# Patient Record
Sex: Male | Born: 1985 | Race: White | Hispanic: No | Marital: Married | Smoking: Former smoker
Health system: Southern US, Community
[De-identification: ages and names within clinical notes are randomized; demographics above are authoritative.]

---

## 2017-12-05 ENCOUNTER — Other Ambulatory Visit: Payer: Self-pay | Admitting: Gastroenterology

## 2017-12-05 DIAGNOSIS — R1013 Epigastric pain: Secondary | ICD-10-CM

## 2017-12-15 ENCOUNTER — Ambulatory Visit
Admission: RE | Admit: 2017-12-15 | Discharge: 2017-12-15 | Disposition: A | Discharge status: Home / Self Care | Payer: BC Managed Care – PPO | Source: Ambulatory Visit | Attending: Gastroenterology | Admitting: Gastroenterology

## 2017-12-15 DIAGNOSIS — R1013 Epigastric pain: Secondary | ICD-10-CM

## 2018-11-08 IMAGING — US US ABDOMEN LIMITED
1 series · 14 of 25 positions shown · non-contrast
Comparison: No prior.

CLINICAL DATA: Epigastric pain.

EXAM:
ULTRASOUND ABDOMEN LIMITED RIGHT UPPER QUADRANT

[Series 1: us abdomen limited · 0.17mm/px · 14 of 38 slices shown]
[im 1/38]
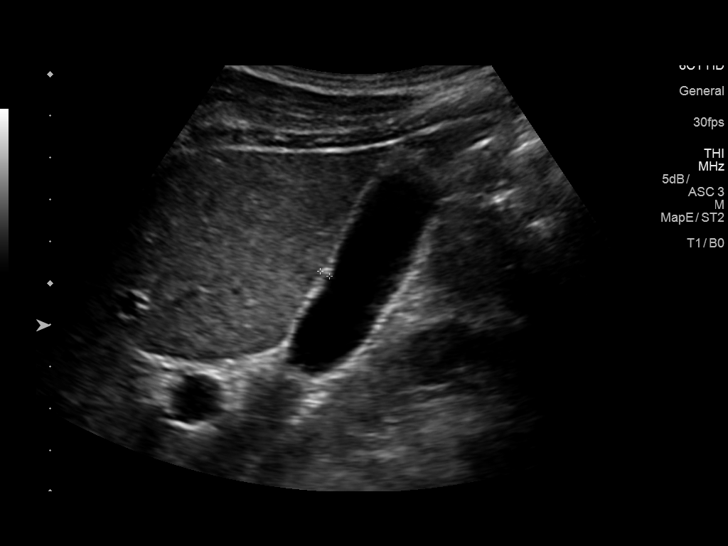
[im 4/38]
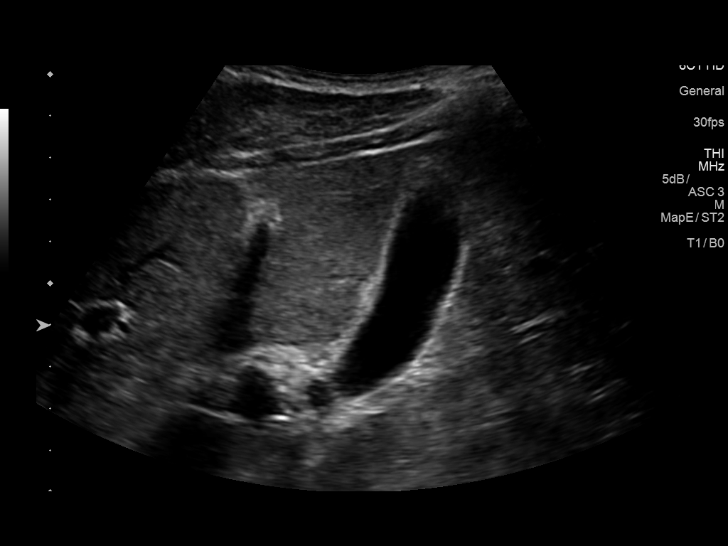
[im 7/38]
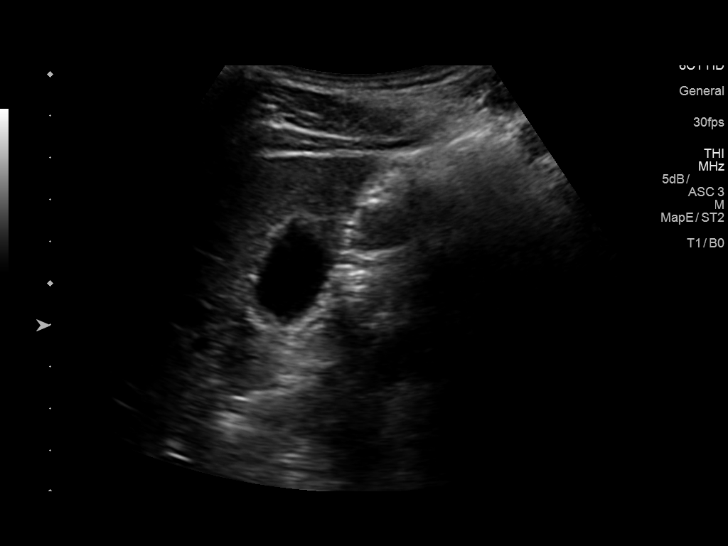
[im 10/38]
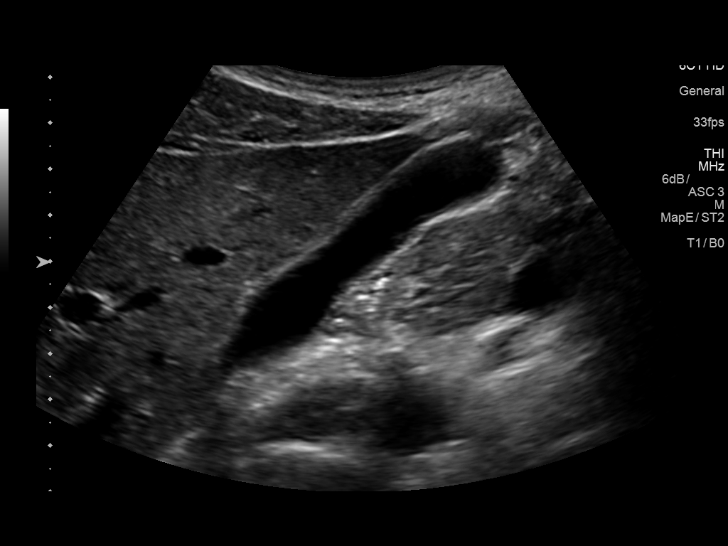
[im 13/38]
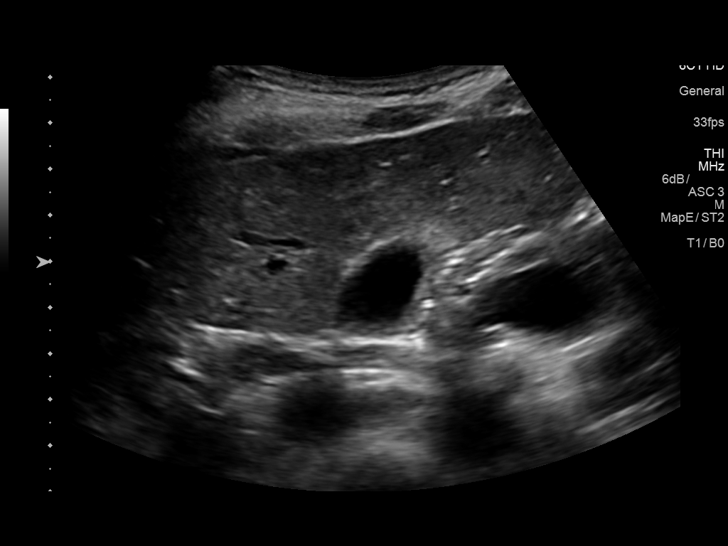
[im 14/38]
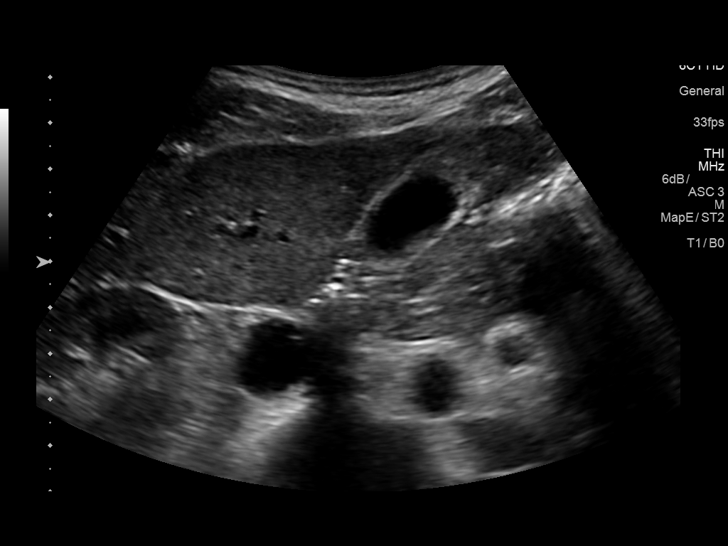
[im 17/38]
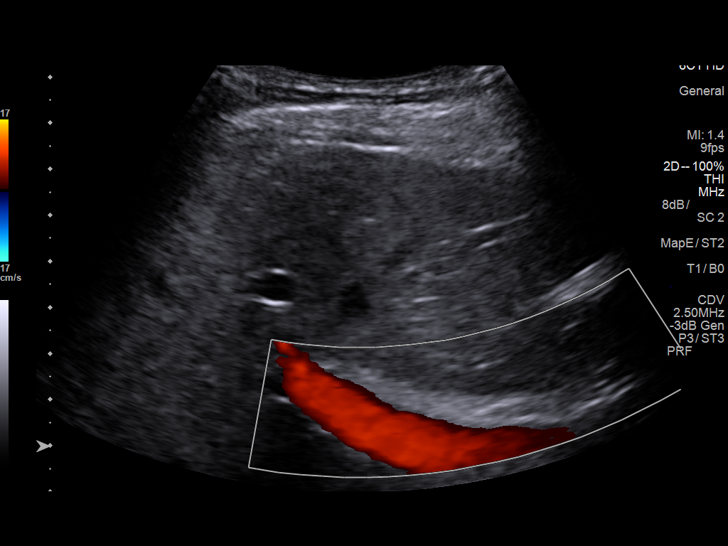
[im 21/38]
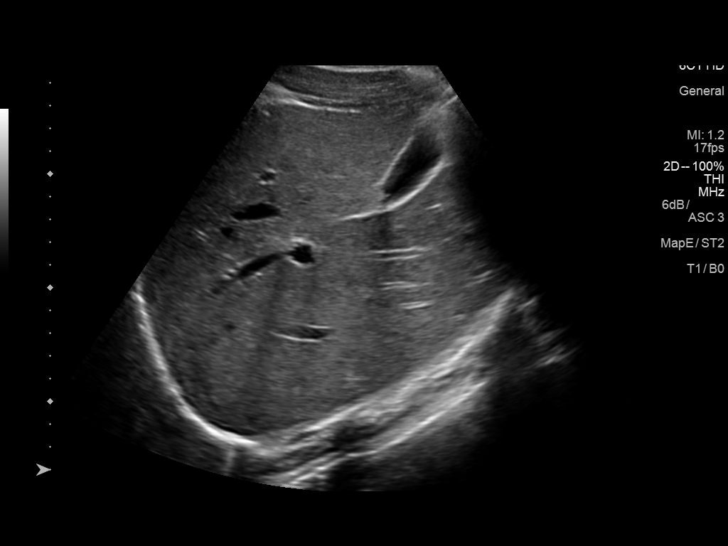
[im 24/38]
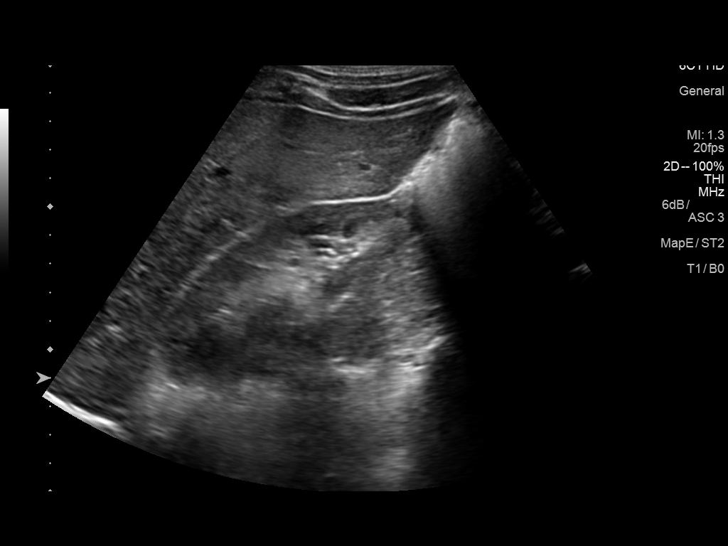
[im 25/38]
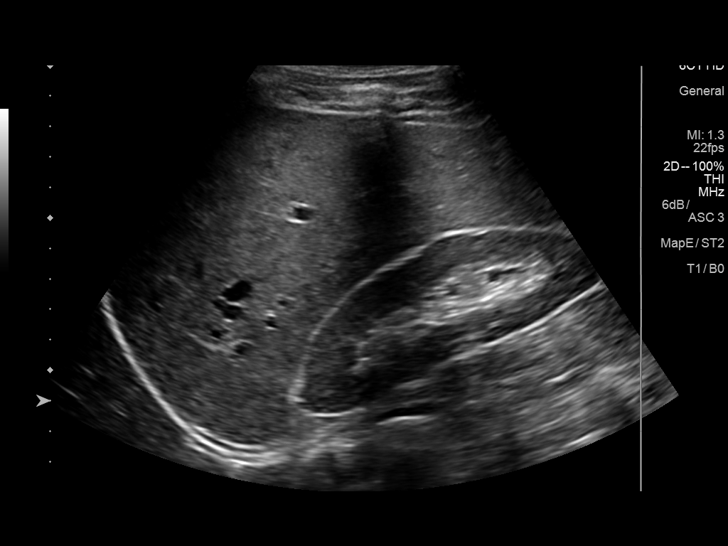
[im 28/38]
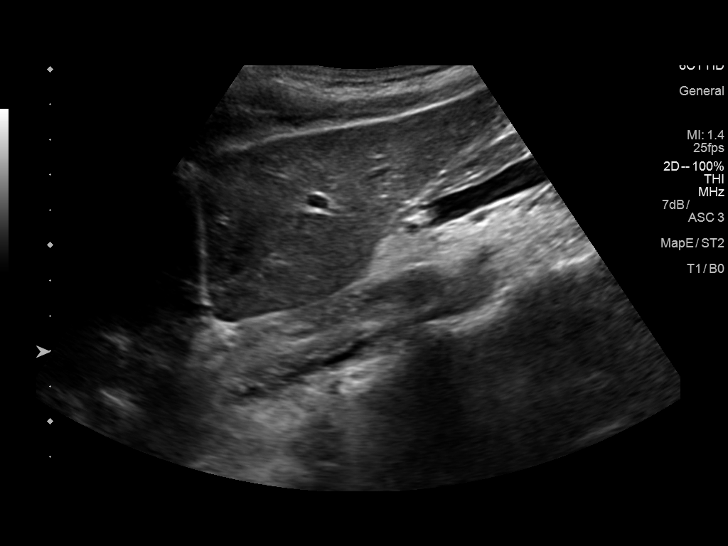
[im 31/38]
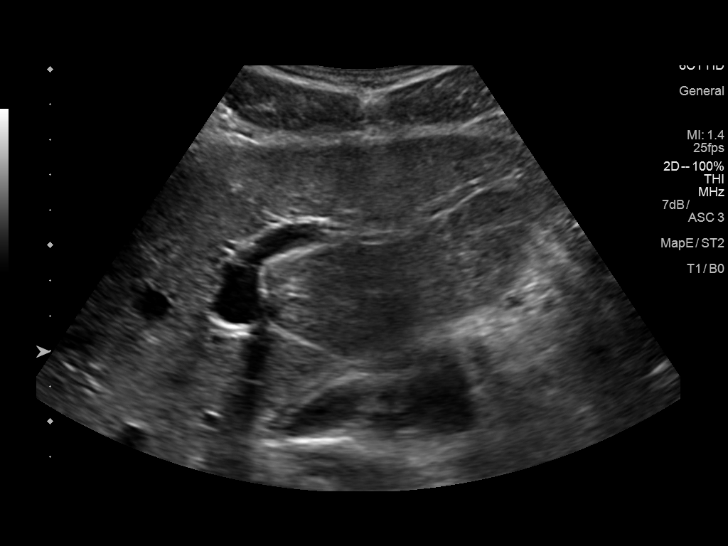
[im 34/38]
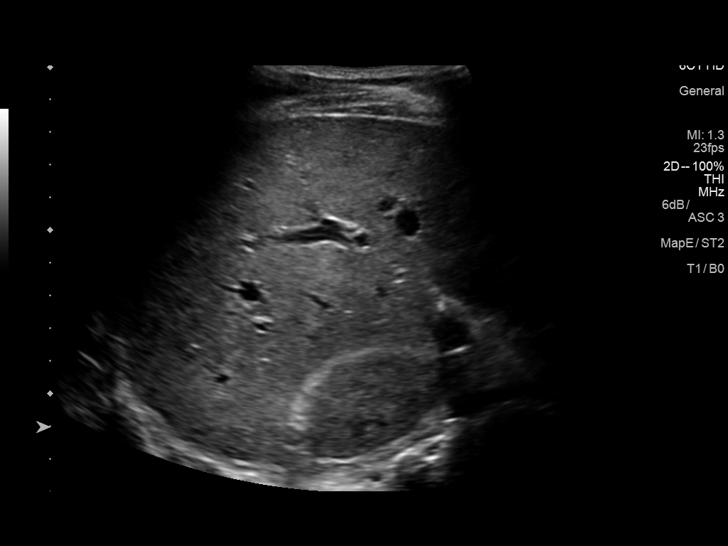
[im 38/38]
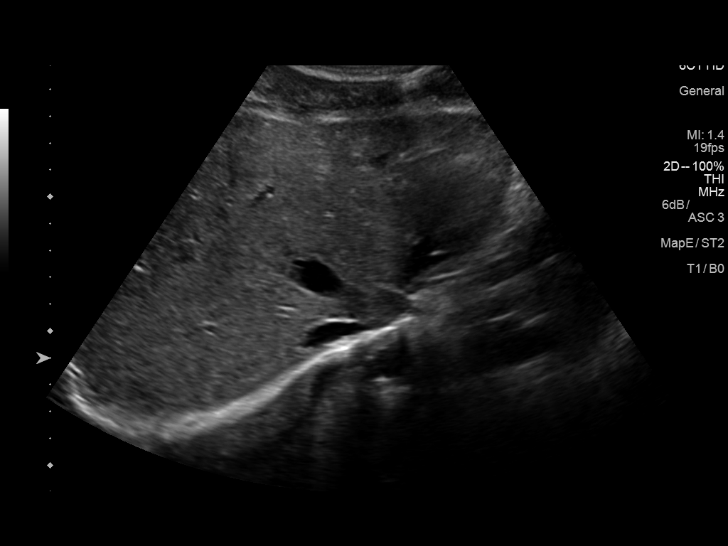

[14 of 25 positions shown; findings below may reference images not displayed]

FINDINGS: Gallbladder:

No gallstones or wall thickening visualized. No sonographic Murphy
sign noted by sonographer.

Common bile duct:

Diameter: 2.6 mm

Liver:

No focal lesion identified. Within normal limits in parenchymal
echogenicity. Portal vein is patent on color Doppler imaging with
normal direction of blood flow towards the liver.
IMPRESSION: No acute or focal abnormality identified.

## 2020-10-22 ENCOUNTER — Other Ambulatory Visit: Payer: Self-pay | Admitting: Physician Assistant

## 2020-10-22 ENCOUNTER — Ambulatory Visit
Admission: RE | Admit: 2020-10-22 | Discharge: 2020-10-22 | Disposition: A | Discharge status: Home / Self Care | Payer: Self-pay | Source: Ambulatory Visit | Attending: Physician Assistant | Admitting: Physician Assistant

## 2020-10-22 DIAGNOSIS — R0789 Other chest pain: Secondary | ICD-10-CM

## 2022-11-16 ENCOUNTER — Ambulatory Visit: Payer: BC Managed Care – PPO | Attending: Cardiology | Admitting: Cardiology

## 2022-11-16 ENCOUNTER — Encounter: Payer: Self-pay | Admitting: Cardiology

## 2022-11-16 VITALS — BP 108/70 | HR 60 | Ht 66.0 in | Wt 151.2 lb

## 2022-11-16 DIAGNOSIS — R079 Chest pain, unspecified: Secondary | ICD-10-CM

## 2022-11-16 DIAGNOSIS — R0602 Shortness of breath: Secondary | ICD-10-CM | POA: Diagnosis not present

## 2022-11-16 NOTE — Patient Instructions (Addendum)
.  Medication Instructions:  Your physician recommends that you continue on your current medications as directed. Please refer to the Current Medication list given to you today.   Labwork: Your physician recommends that you return for lab work in: Today [facility] [address] [place], [place]   Testing/Procedures: Your physician has requested that you have an echocardiogram. Echocardiography is a painless test that uses sound waves to create images of your heart. It provides your doctor with information about the size and shape of your heart and how well your heart's chambers and valves are working. This procedure takes approximately one hour. There are no restrictions for this procedure. Please do NOT wear cologne, perfume, aftershave, or lotions (deodorant is allowed). Please arrive 15 minutes prior to your appointment time.   Follow-Up: Your physician recommends that you schedule a follow-up appointment in: Pending  Any Other Special Instructions Will Be Listed Below (If Applicable).  If you need a refill on your cardiac medications before your next appointment, please call your pharmacy.

## 2022-11-16 NOTE — Progress Notes (Signed)
Clinical Summary Jose Howard is a 37 y.o.male seen today as a new consult for the following medical problesm.    1.Chest pain/fatigue - about 1 week ago had an episode - worked as Engineer, maintenance, had mowed yards - was sitting on couch, abnormal feeling in chest. Difficult to describe beyond that. No other associated symptoms - stood up and walked around. Didn't feel right. Layed down on couch.  - got pulse oximeter, O2 sat reported at 75%. Slowly improved. To above 90%. No SOB. Felt fatigued rest of day and next day.  - went to urgent care Friday.  - appeared pale, cold, clammy - some increased DOE with activities x 3 weeks  CAD risk factors: none   2. Epigastric pain - on and off 5 years -epigastric pain at times - reports prior EGD that was negative - sharp pain, 6/10 in severity. Can occur at rest or with activity. No other associated symptoms - lasts about 1 minute. Not positional - occurs 1-2 times per week.   No past medical history on file.   No Known Allergies   Current Outpatient Medications  Medication Sig Dispense Refill   cetirizine (ZYRTEC) 10 MG tablet Take 10 mg by mouth daily as needed for allergies.     fluticasone (FLONASE) 50 MCG/ACT nasal spray Place 1 spray into the nose daily as needed for allergies.     No current facility-administered medications for this visit.        No Known Allergies    No family history on file.   Social History [name] reports that he has quit smoking. His smoking use included cigarettes. He has quit using smokeless tobacco.  His smokeless tobacco use included chew. [name] has no history on file for alcohol use.   Review of Systems CONSTITUTIONAL: No weight loss, fever, chills, weakness or fatigue.  HEENT: Eyes: No visual loss, blurred vision, double vision or yellow sclerae.No hearing loss, sneezing, congestion, runny nose or sore throat.  SKIN: No rash or itching.  CARDIOVASCULAR: per  hpi RESPIRATORY: per hpi GASTROINTESTINAL: No anorexia, nausea, vomiting or diarrhea. No abdominal pain or blood.  GENITOURINARY: No burning on urination, no polyuria NEUROLOGICAL: No headache, dizziness, syncope, paralysis, ataxia, numbness or tingling in the extremities. No change in bowel or bladder control.  MUSCULOSKELETAL: No muscle, back pain, joint pain or stiffness.  LYMPHATICS: No enlarged nodes. No history of splenectomy.  PSYCHIATRIC: No history of depression or anxiety.  ENDOCRINOLOGIC: No reports of sweating, cold or heat intolerance. No polyuria or polydipsia.  Marland Kitchen   Physical Examination Vitals:   11/16/22 1032  BP: 108/70  Pulse: 60  SpO2: 97%   Filed Weights   11/16/22 1032  Weight: 151 lb 3.2 oz (68.6 kg)    Gen: resting comfortably, no acute distress HEENT: no scleral icterus, pupils equal round and reactive, no palptable cervical adenopathy,  CV Resp: Clear to auscultation bilaterally GI: abdomen is soft, non-tender, non-distended, normal bowel sounds, no hepatosplenomegaly MSK: extremities are warm, no edema.  Skin: warm, no rash Neuro:  no focal deficits Psych: appropriate affect     Assessment and Plan  1.Chest pain - unclear etiology of episode. Transient funny feeling in chest not described specifically as pain or palpitations, felt cold and clammy, home pulse ox with O2 sats in 70s intiially then later trended up, he was not significnatly SOB.  Family checked sat on themselves and was normal. No CAD or PE risk factors -  check D dimer, if elevated then would plan for CT - check echo for any underlying cardiac dysfunction - pending initial results may warrant further testing - EKG today shows NSR. Labs from pcp reviewed and normal       Jose Howard, M.D.

## 2022-11-18 ENCOUNTER — Telehealth: Payer: Self-pay | Admitting: *Deleted

## 2022-11-18 LAB — D-DIMER, QUANTITATIVE: D-DIMER: <0.2 mg/L FEU (ref 0.00–0.49)

## 2022-11-18 NOTE — Telephone Encounter (Signed)
Lesle Chris, LPN 10/03/8117 14:78 AM EDT Back to Top    Notified, copy to pcp.

## 2022-11-18 NOTE — Telephone Encounter (Signed)
-----   Message from Antoine Poche, MD sent at 11/18/2022 11:30 AM EDT ----- Ddimer was negative, no evidence of blood clot. We will see what echo shows  J BrancH MD

## 2022-12-13 IMAGING — CR DG CHEST 1V
1 series · 1 of 1 positions shown · non-contrast
Comparison: Chest radiograph 03/17/2018

CLINICAL DATA: Xiphodynia.  Midsternal pain for 2 months.

EXAM:
CHEST  1 VIEW

[w chest pa]
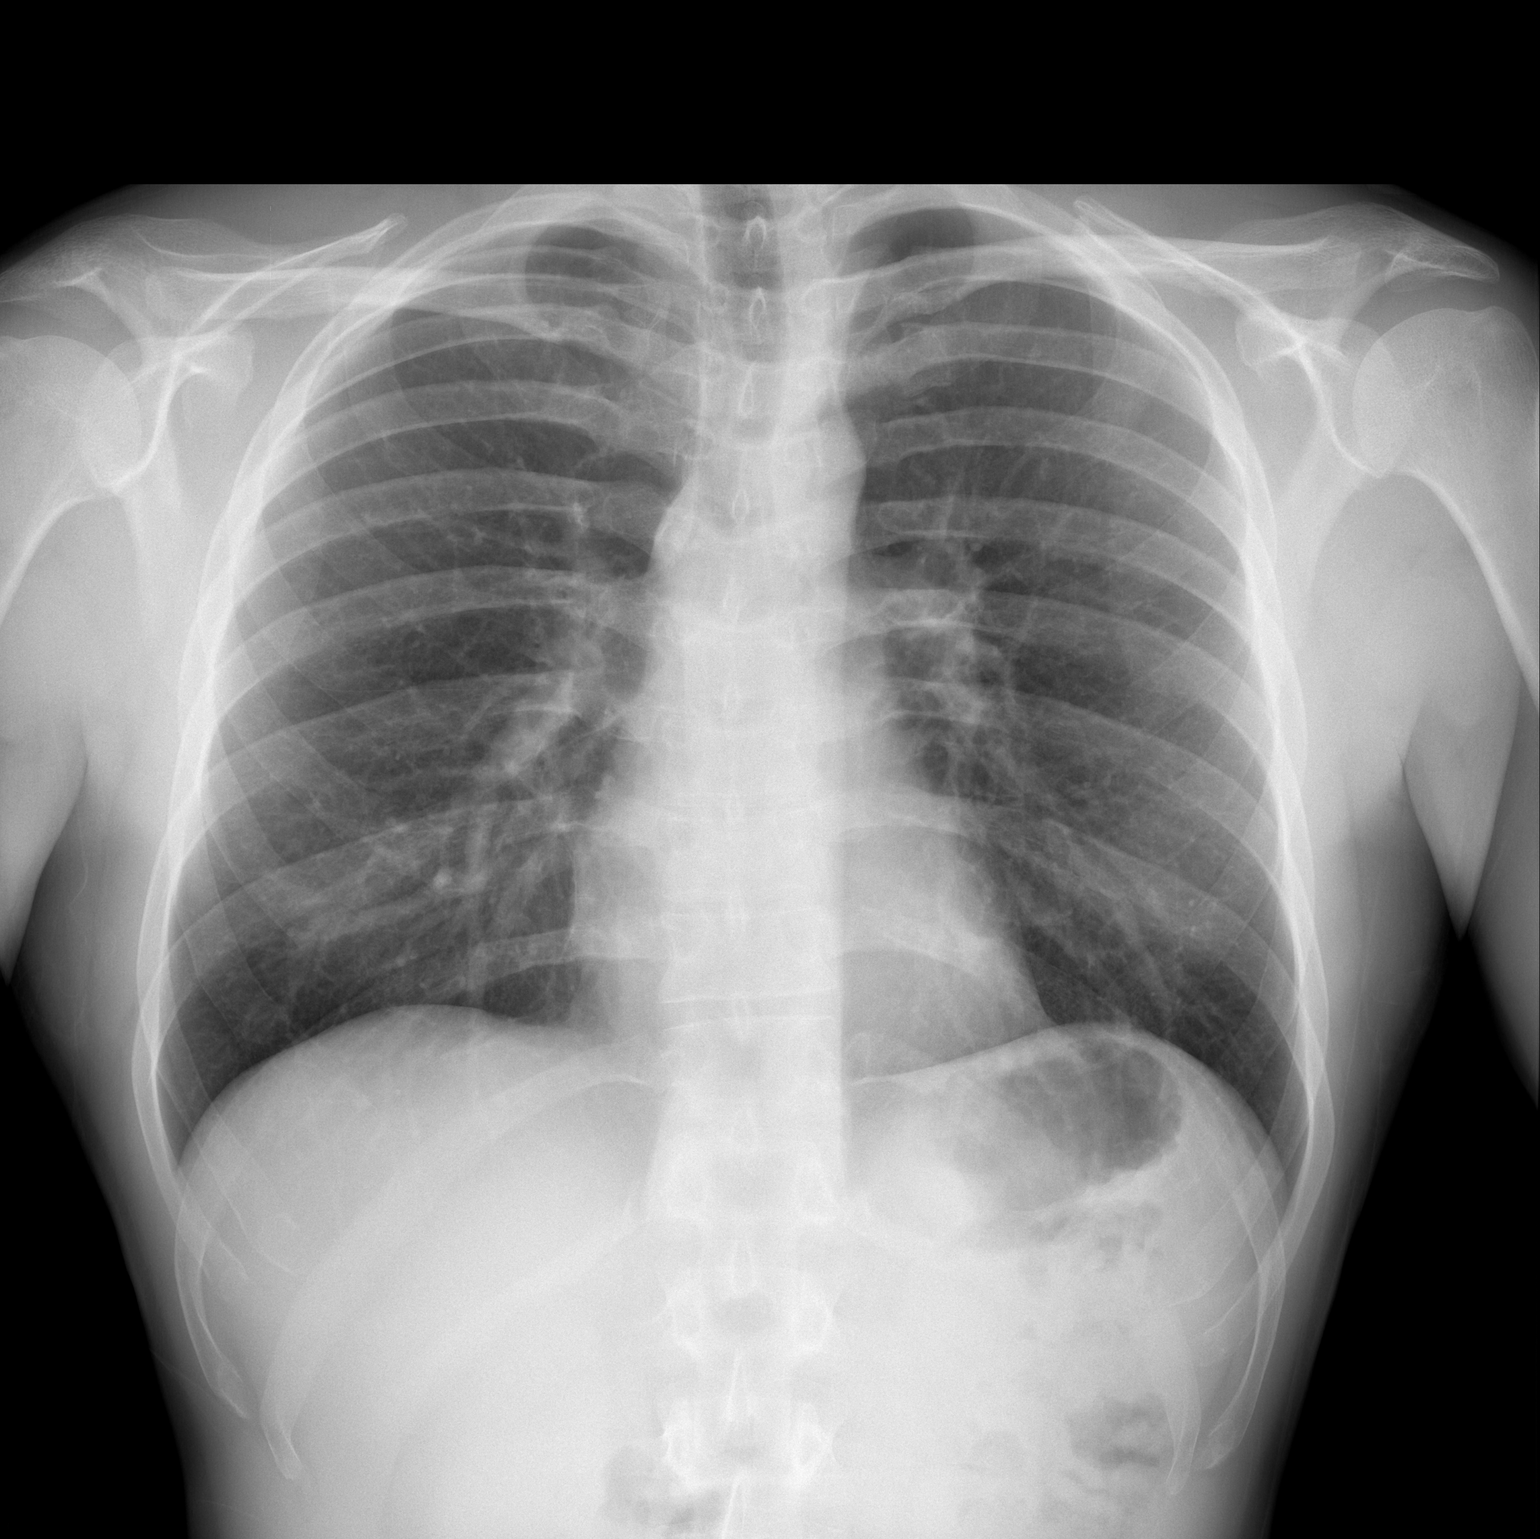

[1 of 1 positions shown; findings below may reference images not displayed]

FINDINGS: Single frontal view of the chest obtained. The cardiomediastinal
contours are normal. The lungs are clear. Pulmonary vasculature is
normal. No consolidation, pleural effusion, or pneumothorax. No
acute osseous abnormalities are seen. Please note the sternum is not
well assessed on this frontal view.
IMPRESSION: Unremarkable AP view of the chest. Please note the sternum is not
well assessed on this single frontal view.

## 2023-01-03 ENCOUNTER — Telehealth: Payer: Self-pay | Admitting: Cardiology

## 2023-01-03 NOTE — Telephone Encounter (Signed)
Patient's spouse is calling to verify how much the appt tomorrow will cost out of pocket.

## 2023-01-04 ENCOUNTER — Ambulatory Visit (HOSPITAL_COMMUNITY): Admission: RE | Admit: 2023-01-04 | Payer: BC Managed Care – PPO | Source: Ambulatory Visit

## 2023-05-16 ENCOUNTER — Emergency Department (HOSPITAL_COMMUNITY): Payer: BC Managed Care – PPO

## 2023-05-16 ENCOUNTER — Other Ambulatory Visit: Payer: Self-pay

## 2023-05-16 ENCOUNTER — Emergency Department (HOSPITAL_COMMUNITY)
Admission: EM | Admit: 2023-05-16 | Discharge: 2023-05-16 | Disposition: A | Discharge status: Home / Self Care | Payer: BC Managed Care – PPO

## 2023-05-16 DIAGNOSIS — S0181XA Laceration without foreign body of other part of head, initial encounter: Secondary | ICD-10-CM

## 2023-05-16 DIAGNOSIS — W11XXXA Fall on and from ladder, initial encounter: Secondary | ICD-10-CM | POA: Insufficient documentation

## 2023-05-16 DIAGNOSIS — S01412A Laceration without foreign body of left cheek and temporomandibular area, initial encounter: Secondary | ICD-10-CM | POA: Insufficient documentation

## 2023-05-16 DIAGNOSIS — S060X1A Concussion with loss of consciousness of 30 minutes or less, initial encounter: Secondary | ICD-10-CM

## 2023-05-16 DIAGNOSIS — W19XXXA Unspecified fall, initial encounter: Secondary | ICD-10-CM

## 2023-05-16 DIAGNOSIS — S61412A Laceration without foreign body of left hand, initial encounter: Secondary | ICD-10-CM

## 2023-05-16 DIAGNOSIS — S0990XA Unspecified injury of head, initial encounter: Secondary | ICD-10-CM | POA: Diagnosis present

## 2023-05-16 DIAGNOSIS — S0101XA Laceration without foreign body of scalp, initial encounter: Secondary | ICD-10-CM | POA: Diagnosis not present

## 2023-05-16 MED ORDER — BACITRACIN ZINC 500 UNIT/GM EX OINT: TOPICAL_OINTMENT | Freq: Once | CUTANEOUS | Status: AC

## 2023-05-16 MED ORDER — OXYCODONE-ACETAMINOPHEN 5-325 MG PO TABS
1.0000 | ORAL_TABLET | Freq: Once | ORAL | Status: AC
  Administered 2023-05-16: 1 via ORAL
  Filled 2023-05-16: qty 1

## 2023-05-16 MED ORDER — LIDOCAINE-EPINEPHRINE (PF) 2 %-1:200000 IJ SOLN
20.0000 mL | Freq: Once | INTRAMUSCULAR | Status: AC
  Administered 2023-05-16: 20 mL
  Filled 2023-05-16: qty 20

## 2023-05-16 MED ORDER — BACITRACIN ZINC 500 UNIT/GM EX OINT
TOPICAL_OINTMENT | CUTANEOUS | Status: AC
  Administered 2023-05-16: 1 via TOPICAL
  Filled 2023-05-16: qty 0.9

## 2023-05-16 NOTE — ED Triage Notes (Signed)
Pt to ED POV. Pt states he fell 12-13 feet from a deer stand right before coming to ED. Pt states deer stand fell on top of him after he fell. Pt has large 2-3 inch deep lac to upper left lip and wound to left head that was dressed PTA. Bleeding controlled PTA. Pt does not remember fall. Unsure of LOC. Pt states his friend called EMS PTA and EMS applied dressings to pt, but pt states he did not want to ride in ambulance so he had his wife drive him to ED instead. GCS 15. AAOx4.

## 2023-05-16 NOTE — Discharge Instructions (Signed)
May take over-the-counter Tylenol alternating with Motrin for pain.  As discussed you will need to return in 7 to 10 days for suture and staple removal.  Please remain in the splint until you are seen by the Ortho doctor.  As discussed, you may follow-up on MyChart for results for your hand x-ray.  Please return if develop fevers, chills, redness around lacerations, begin draining pus or you develop any new or worsening symptoms that are concerning to you.

## 2023-05-16 NOTE — Progress Notes (Signed)
Orthopedic Tech Progress Note Patient Details:  Jose Howard [birth year] [id number]  Ortho Devices Type of Ortho Device: Thumb velcro splint Ortho Device/Splint Location: LUE Ortho Device/Splint Interventions: Ordered, Application, Adjustment   Post Interventions Patient Tolerated: Well Instructions Provided: Care of device, Adjustment of device  [place] A [name] 05/16/2023, 11:01 PM

## 2023-05-16 NOTE — ED Provider Notes (Signed)
[facility] AT [facility] Provider Note   CSN: [id number] Arrival date & time: 05/16/23  1658     History  Chief Complaint  Patient presents with   Jose Howard is a 37 y.o. male.  This is an otherwise healthy 37 year old male present emergency department for evaluation after a fall.  Reports being 10 to 12 feet climbing up a ladder of deer stand when it fell backwards.  He landed semi on his feet into his back.  Deer stand landed on top of him striking the face.  Positive LOC.  Complains of pain to the left side of his head and to face.  Reports recent tetanus shot.  Denies chest pain, abdominal pain, pelvic pain.  Pain to his left hand where laceration is and to base of thumb.  No other pain to extremities.   Fall       Home Medications Prior to Admission medications   Medication Sig Start Date End Date Taking? Authorizing Provider  cetirizine (ZYRTEC) 10 MG tablet Take 10 mg by mouth daily as needed for allergies.    [provider]  fluticasone (FLONASE) 50 MCG/ACT nasal spray Place 1 spray into the nose daily as needed for allergies.    [provider]      Allergies    Patient has no known allergies.    Review of Systems   Review of Systems  Physical Exam Updated Vital Signs BP 112/77   Pulse 79   Temp 98.2 F (36.8 C) (Oral)   Resp 14   SpO2 97%  Physical Exam Vitals and nursing note reviewed.  Constitutional:      General: He is not in acute distress.    Appearance: He is not toxic-appearing.  HENT:     Head: Normocephalic.     Comments: Patient with deep laceration above left lip crossing over hairline of beard.  He also has a small half centimeter laceration to the left temple.  No active bleeding.  Pupils equal round reactive.  Laceration to lip does not invade mouth.  No dental pain.  No tongue lacerations.  Hematoma to the left temple    Nose: Nose normal.     Mouth/Throat:     Mouth:  Mucous membranes are moist.  Eyes:     Extraocular Movements: Extraocular movements intact.     Conjunctiva/sclera: Conjunctivae normal.     Pupils: Pupils are equal, round, and reactive to light.  Cardiovascular:     Rate and Rhythm: Normal rate and regular rhythm.     Pulses: Normal pulses.  Pulmonary:     Effort: Pulmonary effort is normal.     Breath sounds: Normal breath sounds.  Abdominal:     General: Abdomen is flat. There is no distension.     Palpations: Abdomen is soft.     Tenderness: There is no abdominal tenderness. There is no guarding or rebound.  Musculoskeletal:        General: Normal range of motion.     Cervical back: Normal range of motion. No tenderness.     Comments: Patient with stable chest wall nontender.  Pelvis stable nontender.  No midline spinal tenderness.  No tenderness to lower extremities.  5 out of 5 strength in plantarflexion dorsiflexion.  Patient with no tenderness to the right upper extremity.  Left lower extremity with curvilinear laceration to the thenar eminence.  Does have some tenderness to the anatomic snuffbox.  Full  ROM.  Skin:    General: Skin is warm and dry.     Capillary Refill: Capillary refill takes less than 2 seconds.  Neurological:     Mental Status: He is alert and oriented to person, place, and time.  Psychiatric:        Mood and Affect: Mood normal.        Behavior: Behavior normal.     ED Results / Procedures / Treatments   Labs (all labs ordered are listed, but only abnormal results are displayed) Labs Reviewed - No data to display  EKG None  Radiology DG Chest Portable 1 View  Result Date: 05/16/2023 CLINICAL DATA:  Fall 12 feet from a deer stand. Facial laceration. Amnestic to fall. Trauma. EXAM: PORTABLE CHEST 1 VIEW COMPARISON:  10/22/2020 FINDINGS: The lungs appear clear. Cardiac and mediastinal contours normal. No blunting of the costophrenic angles. No acute bony findings. IMPRESSION: 1. No acute findings.  Electronically Signed   By: Gaylyn Rong M.D.   On: 05/16/2023 21:40   CT Head Wo Contrast  Result Date: 05/16/2023 CLINICAL DATA:  Fall, head and facial trauma EXAM: CT HEAD WITHOUT CONTRAST CT MAXILLOFACIAL WITHOUT CONTRAST TECHNIQUE: Multidetector CT imaging of the head and maxillofacial structures were performed using the standard protocol without intravenous contrast. Multiplanar CT image reconstructions of the maxillofacial structures were also generated. RADIATION DOSE REDUCTION: This exam was performed according to the departmental dose-optimization program which includes automated exposure control, adjustment of the mA and/or kV according to patient size and/or use of iterative reconstruction technique. COMPARISON:  None Available. FINDINGS: CT HEAD FINDINGS Brain: No evidence of acute infarct, hemorrhage, mass, mass effect, or midline shift. No hydrocephalus or extra-axial fluid collection. Vascular: No hyperdense vessel. Skull: Negative for fracture or focal lesion. CT MAXILLOFACIAL FINDINGS Osseous: No fracture or mandibular dislocation. No destructive process. Orbits: No traumatic or inflammatory finding. Sinuses: Clear. Soft tissues: Left lateral parietal scalp hematoma, with superficial skin staples. IMPRESSION: 1. No acute intracranial process. 2. No acute facial bone fracture. Electronically Signed   By: Wiliam Ke M.D.   On: 05/16/2023 21:23   CT Maxillofacial Wo Contrast  Result Date: 05/16/2023 CLINICAL DATA:  Fall, head and facial trauma EXAM: CT HEAD WITHOUT CONTRAST CT MAXILLOFACIAL WITHOUT CONTRAST TECHNIQUE: Multidetector CT imaging of the head and maxillofacial structures were performed using the standard protocol without intravenous contrast. Multiplanar CT image reconstructions of the maxillofacial structures were also generated. RADIATION DOSE REDUCTION: This exam was performed according to the departmental dose-optimization program which includes automated exposure  control, adjustment of the mA and/or kV according to patient size and/or use of iterative reconstruction technique. COMPARISON:  None Available. FINDINGS: CT HEAD FINDINGS Brain: No evidence of acute infarct, hemorrhage, mass, mass effect, or midline shift. No hydrocephalus or extra-axial fluid collection. Vascular: No hyperdense vessel. Skull: Negative for fracture or focal lesion. CT MAXILLOFACIAL FINDINGS Osseous: No fracture or mandibular dislocation. No destructive process. Orbits: No traumatic or inflammatory finding. Sinuses: Clear. Soft tissues: Left lateral parietal scalp hematoma, with superficial skin staples. IMPRESSION: 1. No acute intracranial process. 2. No acute facial bone fracture. Electronically Signed   By: Wiliam Ke M.D.   On: 05/16/2023 21:23    Procedures .Marland KitchenLaceration Repair  Date/Time: 05/16/2023 8:05 PM  Performed by: Coral Spikes, DO Authorized by: Coral Spikes, DO   Consent:    Consent obtained:  Verbal   Consent given by:  Patient   Risks discussed:  Infection, pain, tendon damage, vascular  damage, poor wound healing, poor cosmetic result, nerve damage and need for additional repair   Alternatives discussed:  No treatment, delayed treatment and referral Universal protocol:    Procedure explained and questions answered to patient or proxy's satisfaction: yes     Patient identity confirmed:  Verbally with patient Anesthesia:    Anesthesia method:  Local infiltration   Local anesthetic:  Lidocaine 1% WITH epi Laceration details:    Location:  Face   Face location:  L cheek   Length (cm):  4   Depth (mm):  10 Pre-procedure details:    Preparation:  Patient was prepped and draped in usual sterile fashion Exploration:    Hemostasis achieved with:  Direct pressure   Imaging outcome: foreign body not noted     Wound exploration: wound explored through full range of motion and entire depth of wound visualized     Wound extent: no foreign body   Treatment:     Area cleansed with:  Saline   Amount of cleaning:  Standard Skin repair:    Repair method:  Sutures   Suture size:  5-0   Suture material:  Prolene and fast-absorbing gut   Suture technique:  Simple interrupted   Number of sutures:  6 (2 burried in addition to visible.) Approximation:    Approximation:  Close Repair type:    Repair type:  Intermediate Post-procedure details:    Dressing:  Antibiotic ointment   Procedure completion:  Tolerated well, no immediate complications .Marland KitchenLaceration Repair  Date/Time: 05/16/2023 8:18 PM  Performed by: Coral Spikes, DO Authorized by: Coral Spikes, DO   Consent:    Consent obtained:  Verbal   Risks discussed:  Infection, pain, retained foreign body and vascular damage Universal protocol:    Procedure explained and questions answered to patient or proxy's satisfaction: yes     Patient identity confirmed:  Verbally with patient Anesthesia:    Anesthesia method:  Local infiltration   Local anesthetic:  Lidocaine 1% WITH epi Laceration details:    Location:  Scalp   Scalp location:  L temporal   Length (cm):  4 Exploration:    Hemostasis achieved with:  Epinephrine and direct pressure Treatment:    Area cleansed with:  Saline   Amount of cleaning:  Standard Skin repair:    Repair method:  Staples   Number of staples:  5 Approximation:    Approximation:  Close Repair type:    Repair type:  Simple Post-procedure details:    Dressing:  Open (no dressing)   Procedure completion:  Tolerated .Marland KitchenLaceration Repair  Date/Time: 05/16/2023 10:29 PM  Performed by: Coral Spikes, DO Authorized by: Coral Spikes, DO   Consent:    Consent obtained:  Verbal   Risks discussed:  Infection, pain and retained foreign body   Alternatives discussed:  No treatment Universal protocol:    Patient identity confirmed:  Verbally with patient Anesthesia:    Anesthesia method:  Local infiltration   Local anesthetic:  Lidocaine 1% WITH  epi Laceration details:    Location:  Hand   Hand location:  L palm   Length (cm):  4 Exploration:    Imaging obtained: x-ray     Imaging outcome: foreign body not noted   Treatment:    Area cleansed with:  Saline Skin repair:    Repair method:  Sutures   Suture size:  4-0   Suture material:  Prolene   Number of sutures:  4 Approximation:  Approximation:  Close Repair type:    Repair type:  Simple Post-procedure details:    Dressing:  Splint for protection and non-adherent dressing   Procedure completion:  Tolerated     Medications Ordered in ED Medications  bacitracin ointment (has no administration in time range)  bacitracin 500 UNIT/GM ointment (has no administration in time range)  lidocaine-EPINEPHrine (XYLOCAINE W/EPI) 2 %-1:200000 (PF) injection 20 mL (20 mLs Infiltration Given by Other 05/16/23 1750)  oxyCODONE-acetaminophen (PERCOCET/ROXICET) 5-325 MG per tablet 1 tablet (1 tablet Oral Given 05/16/23 2101)    ED Course/ Medical Decision Making/ A&P Clinical Course as of 05/16/23 2231  Tue May 16, 2023  2133 CT head and face negative.  Chest x-ray also negative.  X-ray pending final radiology read, but independently reviewed by myself with no foreign bodies.  No overt fractures.  Patient and wife would like to leave.  Shared decision making regarding doing so with pending hand x-ray.  For this is reasonable as patient only has tenderness to base of thumb and he is now in thumb spica.  They understand risk and will follow-up MyChart as well as Ortho.  Return for staple/suture removal.  Stable for discharge at this time. [TY]    Clinical Course User Index [TY] Coral Spikes, DO                                 Medical Decision Making This is a 37 year old male present emergency department after a fall.  Struck his head with LOC.  Vital signs reassuring.  Patient with laceration to left temple, left cheek, and left hand.  Wife presented to bedside and reported the  patient had tetanus 2 years ago.  Will forego tetanus.  Does not have any bleeding currently.  Will clean wounds plan for laceration repair is appropriate.  See procedure notes.  Will get CT of head given his LOC.  Will get x-ray of the left hand to evaluate for foreign body and for fracture.  Will place in thumb spica as he does have some anatomic snuffbox tenderness. Will also get chest x-ray out of abundance of caution given trauma, however does have equal breath sounds and chest wall stable.  He has soft nontender abdomen.  No midline spinal tenderness.  Will forego other advanced imaging at this time as his injuries seemingly are isolated to head and to his left hand.  See ED course for further MDM and disposition.   Amount and/or Complexity of Data Reviewed Radiology: ordered.  Risk OTC drugs. Prescription drug management.          Final Clinical Impression(s) / ED Diagnoses Final diagnoses:  None    Rx / DC Orders ED Discharge Orders     None         Coral Spikes, DO 05/16/23 2231
